# Patient Record
Sex: Male | Born: 2000 | Race: White | Hispanic: No | Marital: Single | Smoking: Former smoker
Health system: Southern US, Community
[De-identification: ages and names within clinical notes are randomized; demographics above are authoritative.]

## PROBLEM LIST (undated history)

## (undated) DIAGNOSIS — T7840XA Allergy, unspecified, initial encounter: Secondary | ICD-10-CM

## (undated) DIAGNOSIS — F410 Panic disorder [episodic paroxysmal anxiety] without agoraphobia: Secondary | ICD-10-CM

## (undated) DIAGNOSIS — K219 Gastro-esophageal reflux disease without esophagitis: Secondary | ICD-10-CM

## (undated) DIAGNOSIS — F32A Depression, unspecified: Secondary | ICD-10-CM

## (undated) DIAGNOSIS — J45909 Unspecified asthma, uncomplicated: Secondary | ICD-10-CM

## (undated) HISTORY — DX: Gastro-esophageal reflux disease without esophagitis: K21.9

## (undated) HISTORY — DX: Unspecified asthma, uncomplicated: J45.909

## (undated) HISTORY — DX: Allergy, unspecified, initial encounter: T78.40XA

## (undated) HISTORY — DX: Depression, unspecified: F32.A

---
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
## 2022-02-09 DIAGNOSIS — F418 Other specified anxiety disorders: Secondary | ICD-10-CM | POA: Insufficient documentation

## 2022-02-17 DIAGNOSIS — R002 Palpitations: Secondary | ICD-10-CM | POA: Insufficient documentation

## 2022-11-23 DIAGNOSIS — M25512 Pain in left shoulder: Secondary | ICD-10-CM | POA: Insufficient documentation

## 2023-12-09 ENCOUNTER — Emergency Department (HOSPITAL_COMMUNITY)
Admission: EM | Admit: 2023-12-09 | Discharge: 2023-12-10 | Disposition: A | Discharge status: Home / Self Care | Attending: Emergency Medicine | Admitting: Emergency Medicine

## 2023-12-09 ENCOUNTER — Encounter (HOSPITAL_COMMUNITY): Payer: Self-pay

## 2023-12-09 DIAGNOSIS — K219 Gastro-esophageal reflux disease without esophagitis: Secondary | ICD-10-CM | POA: Insufficient documentation

## 2023-12-09 DIAGNOSIS — R079 Chest pain, unspecified: Secondary | ICD-10-CM | POA: Diagnosis present

## 2023-12-09 HISTORY — DX: Panic disorder (episodic paroxysmal anxiety): F41.0

## 2023-12-09 LAB — COMPREHENSIVE METABOLIC PANEL WITH GFR
ALT: 36 U/L (ref 0–44)
AST: 19 U/L (ref 15–41)
Albumin: 4.6 g/dL (ref 3.5–5.0)
Alkaline Phosphatase: 68 U/L (ref 38–126)
Anion gap: 10 (ref 5–15)
BUN: 11 mg/dL (ref 6–20)
CO2: 27 mmol/L (ref 22–32)
Calcium: 9.9 mg/dL (ref 8.9–10.3)
Chloride: 104 mmol/L (ref 98–111)
Creatinine, Ser: 0.88 mg/dL (ref 0.61–1.24)
GFR, Estimated: >60 mL/min (ref >60)
Glucose, Bld: 94 mg/dL (ref 70–99)
Potassium: 4.3 mmol/L (ref 3.5–5.1)
Sodium: 141 mmol/L (ref 135–145)
Total Bilirubin: 2.2 mg/dL — ABNORMAL HIGH (ref 0.0–1.2)
Total Protein: 7.5 g/dL (ref 6.5–8.1)

## 2023-12-09 LAB — CBC WITH DIFFERENTIAL/PLATELET
Abs Immature Granulocytes: 0.03 K/uL (ref 0.00–0.07)
Basophils Absolute: 0.1 K/uL (ref 0.0–0.1)
Basophils Relative: 1 %
Eosinophils Absolute: 0.5 K/uL (ref 0.0–0.5)
Eosinophils Relative: 7 %
HCT: 44.7 % (ref 39.0–52.0)
Hemoglobin: 15 g/dL (ref 13.0–17.0)
Immature Granulocytes: 0 %
Lymphocytes Relative: 44 %
Lymphs Abs: 3 K/uL (ref 0.7–4.0)
MCH: 31.4 pg (ref 26.0–34.0)
MCHC: 33.6 g/dL (ref 30.0–36.0)
MCV: 93.5 fL (ref 80.0–100.0)
Monocytes Absolute: 0.5 K/uL (ref 0.1–1.0)
Monocytes Relative: 8 %
Neutro Abs: 2.8 K/uL (ref 1.7–7.7)
Neutrophils Relative %: 40 %
Platelets: 277 K/uL (ref 150–400)
RBC: 4.78 MIL/uL (ref 4.22–5.81)
RDW: 12.8 % (ref 11.5–15.5)
WBC: 6.9 K/uL (ref 4.0–10.5)
nRBC: 0 % (ref 0.0–0.2)

## 2023-12-09 LAB — TROPONIN I (HIGH SENSITIVITY): Troponin I (High Sensitivity): <2 ng/L (ref <18)

## 2023-12-09 NOTE — ED Triage Notes (Addendum)
 Pt's pain started out as heartburn last night and has gradually has gotten worse to where the pt is SOB. Pt is also has abd. Pain. A&Ox4. Pt walked to triage from waiting room with no issues. Pt states he took tums for his heartburn

## 2023-12-10 LAB — TROPONIN I (HIGH SENSITIVITY): Troponin I (High Sensitivity): 2 ng/L (ref <18)

## 2023-12-10 MED ORDER — PANTOPRAZOLE SODIUM 20 MG PO TBEC: 20.0000 mg | DELAYED_RELEASE_TABLET | Freq: Every day | ORAL | 1 refills | Status: DC

## 2023-12-10 MED ORDER — ALUM & MAG HYDROXIDE-SIMETH 200-200-20 MG/5ML PO SUSP
30.0000 mL | Freq: Once | ORAL | Status: AC
  Administered 2023-12-10: 30 mL via ORAL
  Filled 2023-12-10: qty 30

## 2023-12-10 NOTE — Discharge Instructions (Addendum)
 You were seen today for chest pain.  Your workup is reassuring.  This is likely related to reflux.  Avoid eating late at night.  Avoid excessive alcohol use or NSAID use.  Trial pantoprazole.  This needs to be taken daily to reduce acid production.

## 2023-12-10 NOTE — ED Provider Notes (Signed)
 [place] EMERGENCY DEPARTMENT AT [facility] Provider Note   CSN: [id number] Arrival date & time: 12/09/23  2128     Patient presents with: Anxiety, Panic Attack, and Chest Pain   Hunter Jordan is a 23 y.o. male.   HPI     This is a 23 year old male who presents with acid reflux.  Patient reports he had severe acid reflux starting last night.  He ate 3 pieces of pizza prior to going to bed.  He would lay down and woke up with intense burning pain in his chest.  He states that he took Tums with minimal relief.  Pain persisted and he developed some shortness of breath today.  Since present hunting, symptoms have essentially resolved.  He did have 1 episode of emesis last night.  Has previously been on omeprazole for acid reflux symptoms.  Prior to Admission medications   Medication Sig Start Date End Date Taking? Authorizing Provider  pantoprazole (PROTONIX) 20 MG tablet Take 1 tablet (20 mg total) by mouth daily. 12/10/23  Yes Cordella Nyquist, Charmaine FALCON, MD  cyclobenzaprine  (FLEXERIL ) 5 MG tablet Take 1 tablet (5 mg total) by mouth 3 (three) times daily as needed (muscle soreness). 10/24/19   Knapp, Iva, MD  naproxen  (NAPROSYN ) 250 MG tablet Take 1 tablet (250 mg total) by mouth 2 (two) times daily. 10/24/19   Knapp, Iva, MD    Allergies: Patient has no allergy information on record.    Review of Systems  Constitutional:  Negative for fever.  Cardiovascular:  Positive for chest pain.  Gastrointestinal:  Positive for abdominal pain, nausea and vomiting.  All other systems reviewed and are negative.   Updated Vital Signs BP 118/75   Pulse (!) 59   Temp 98.9 F (37.2 C) (Oral)   Resp 18   Ht 1.803 m (5' 11)   Wt 92.5 kg   SpO2 97%   BMI 28.45 kg/m   Physical Exam Vitals and nursing note reviewed.  Constitutional:      Appearance: He is well-developed.  HENT:     Head: Normocephalic and atraumatic.  Eyes:     Pupils: Pupils are equal, round, and reactive to  light.  Cardiovascular:     Rate and Rhythm: Normal rate and regular rhythm.     Heart sounds: Normal heart sounds. No murmur heard. Pulmonary:     Effort: Pulmonary effort is normal. No respiratory distress.     Breath sounds: Normal breath sounds. No wheezing.  Abdominal:     General: Bowel sounds are normal.     Palpations: Abdomen is soft.     Tenderness: There is no abdominal tenderness. There is no rebound.  Musculoskeletal:     Cervical back: Neck supple.  Lymphadenopathy:     Cervical: No cervical adenopathy.  Skin:    General: Skin is warm and dry.  Neurological:     General: No focal deficit present.     Mental Status: He is alert and oriented to person, place, and time.     (all labs ordered are listed, but only abnormal results are displayed) Labs Reviewed  COMPREHENSIVE METABOLIC PANEL WITH GFR - Abnormal; Notable for the following components:      Result Value   Total Bilirubin 2.2 (*)    All other components within normal limits  CBC WITH DIFFERENTIAL/PLATELET  TROPONIN I (HIGH SENSITIVITY)  TROPONIN I (HIGH SENSITIVITY)    EKG: EKG Interpretation Date/Time:  Thursday December 09 2023 21:35:55 EDT Ventricular Rate:  68 PR Interval:  154 QRS Duration:  94 QT Interval:  344 QTC Calculation: 365 R Axis:   91  Text Interpretation: Normal sinus rhythm Rightward axis Borderline ECG No previous ECGs available Confirmed by Bari Pfeiffer ([id number]) on 12/10/2023 3:11:14 AM  Radiology: No results found.   Procedures   Medications Ordered in the ED  alum & mag hydroxide-simeth (MAALOX/MYLANTA) 200-200-20 MG/5ML suspension 30 mL (has no administration in time range)                                    Medical Decision Making Amount and/or Complexity of Data Reviewed Labs: ordered.  Risk OTC drugs. Prescription drug management.   This patient presents to the ED for concern of acid reflux, chest pain, this involves an extensive number of treatment  options, and is a complaint that carries with it a high risk of complications and morbidity.  I considered the following differential and admission for this acute, potentially life threatening condition.  The differential diagnosis includes reflux, ACS, pancreatitis, cholecystitis  MDM:    This is a 23 year old male who presents with burning chest discomfort.  Symptoms have essentially resolved.  Exam is benign.  No abdominal pain or tenderness to palpation.  Labs reviewed from triage.  Normal troponin.  Normal metabolites and LFTs.  EKG is without acute ischemia or arrhythmia.  Given his age and classic symptoms, suspect reflux.  Will start on pantoprazole.  Discussed avoiding alcohol and NSAIDs.  (Labs, imaging, consults)  Labs: I Ordered, and personally interpreted labs.  The pertinent results include: CBC, CMP, troponin  Imaging Studies ordered: I ordered imaging studies including none I independently visualized and interpreted imaging. I agree with the radiologist interpretation  Additional history obtained from chart review.  External records from outside source obtained and reviewed including prior evaluations  Cardiac Monitoring: The patient was not maintained on a cardiac monitor.  If on the cardiac monitor, I personally viewed and interpreted the cardiac monitored which showed an underlying rhythm of: N/A  Reevaluation: After the interventions noted above, I reevaluated the patient and found that they have :resolved  Social Determinants of Health:  lives independently  Disposition: Discharge  Co morbidities that complicate the patient evaluation  Past Medical History:  Diagnosis Date   Panic      Medicines Meds ordered this encounter  Medications   alum & mag hydroxide-simeth (MAALOX/MYLANTA) 200-200-20 MG/5ML suspension 30 mL   pantoprazole (PROTONIX) 20 MG tablet    Sig: Take 1 tablet (20 mg total) by mouth daily.    Dispense:  30 tablet    Refill:  1    I  have reviewed the patients home medicines and have made adjustments as needed  Problem List / ED Course: Problem List Items Addressed This Visit   None Visit Diagnoses       Gastroesophageal reflux disease, unspecified whether esophagitis present    -  Primary   Relevant Medications   alum & mag hydroxide-simeth (MAALOX/MYLANTA) 200-200-20 MG/5ML suspension 30 mL (Start on 12/10/2023  3:15 AM)   pantoprazole (PROTONIX) 20 MG tablet                Final diagnoses:  Gastroesophageal reflux disease, unspecified whether esophagitis present    ED Discharge Orders          Ordered    pantoprazole (PROTONIX) 20 MG tablet  Daily  12/10/23 9688               Bari Charmaine FALCON, MD 12/10/23 [phone]

## 2024-03-21 ENCOUNTER — Emergency Department (HOSPITAL_COMMUNITY)
Admission: EM | Admit: 2024-03-21 | Discharge: 2024-03-21 | Disposition: A | Discharge status: Home / Self Care | Payer: Self-pay | Attending: Emergency Medicine | Admitting: Emergency Medicine

## 2024-03-21 ENCOUNTER — Encounter (HOSPITAL_COMMUNITY): Payer: Self-pay | Admitting: *Deleted

## 2024-03-21 ENCOUNTER — Other Ambulatory Visit: Payer: Self-pay

## 2024-03-21 ENCOUNTER — Emergency Department (HOSPITAL_COMMUNITY)

## 2024-03-21 DIAGNOSIS — R079 Chest pain, unspecified: Secondary | ICD-10-CM | POA: Insufficient documentation

## 2024-03-21 DIAGNOSIS — R0602 Shortness of breath: Secondary | ICD-10-CM | POA: Insufficient documentation

## 2024-03-21 DIAGNOSIS — R55 Syncope and collapse: Secondary | ICD-10-CM | POA: Insufficient documentation

## 2024-03-21 DIAGNOSIS — H538 Other visual disturbances: Secondary | ICD-10-CM | POA: Insufficient documentation

## 2024-03-21 LAB — BASIC METABOLIC PANEL WITH GFR
Anion gap: 13 (ref 5–15)
BUN: 12 mg/dL (ref 6–20)
CO2: 27 mmol/L (ref 22–32)
Calcium: 10 mg/dL (ref 8.9–10.3)
Chloride: 104 mmol/L (ref 98–111)
Creatinine, Ser: 0.83 mg/dL (ref 0.61–1.24)
GFR, Estimated: >60 mL/min (ref >60)
Glucose, Bld: 94 mg/dL (ref 70–99)
Potassium: 3.7 mmol/L (ref 3.5–5.1)
Sodium: 143 mmol/L (ref 135–145)

## 2024-03-21 LAB — CBC WITH DIFFERENTIAL/PLATELET
Abs Immature Granulocytes: 0.03 K/uL (ref 0.00–0.07)
Basophils Absolute: 0.1 K/uL (ref 0.0–0.1)
Basophils Relative: 1 %
Eosinophils Absolute: 0.5 K/uL (ref 0.0–0.5)
Eosinophils Relative: 8 %
HCT: 44.8 % (ref 39.0–52.0)
Hemoglobin: 15.2 g/dL (ref 13.0–17.0)
Immature Granulocytes: 1 %
Lymphocytes Relative: 41 %
Lymphs Abs: 2.7 K/uL (ref 0.7–4.0)
MCH: 31.5 pg (ref 26.0–34.0)
MCHC: 33.9 g/dL (ref 30.0–36.0)
MCV: 92.8 fL (ref 80.0–100.0)
Monocytes Absolute: 0.5 K/uL (ref 0.1–1.0)
Monocytes Relative: 8 %
Neutro Abs: 2.7 K/uL (ref 1.7–7.7)
Neutrophils Relative %: 41 %
Platelets: 301 K/uL (ref 150–400)
RBC: 4.83 MIL/uL (ref 4.22–5.81)
RDW: 12.7 % (ref 11.5–15.5)
WBC: 6.6 K/uL (ref 4.0–10.5)
nRBC: 0 % (ref 0.0–0.2)

## 2024-03-21 LAB — TROPONIN T, HIGH SENSITIVITY
Troponin T High Sensitivity: <15 ng/L (ref 0–19)
Troponin T High Sensitivity: <15 ng/L (ref 0–19)

## 2024-03-21 LAB — CBG MONITORING, ED: Glucose-Capillary: 97 mg/dL (ref 70–99)

## 2024-03-21 LAB — LIPASE, BLOOD: Lipase: 21 U/L (ref 11–51)

## 2024-03-21 NOTE — ED Provider Notes (Signed)
 [place] EMERGENCY DEPARTMENT AT [facility] Provider Note   CSN: [id number] Arrival date & time: 03/21/24  1703     Patient presents with: Near Syncope   Carolyn Dannon Nguyenthi is a 23 y.o. male.  He said he was driving today when he acutely felt like he could not breeze and then became very dizzy and lightheaded and felt like he was blacking out.  He is not sure he fully went unconscious but he was sweaty and nauseous.  He said he has had this happen before and they have called the panic attacks.  He denies that he was anxious about anything.  He he said he has seen a cardiologist before.  Was prescribed hydroxyzine but does not like taking it because it makes him feel drowsy in the morning.  Concerned he may have had a stroke.  Does not have a primary care doctor yet because recently moved back in the area but is working on getting his Medicaid reinstated.  {Add pertinent medical, surgical, social history, OB history to YEP:67052} The history is provided by the patient.  Near Syncope This is a recurrent problem. The problem occurs rarely. The problem has been resolved. Associated symptoms include chest pain and shortness of breath. Pertinent negatives include no abdominal pain and no headaches. Nothing aggravates the symptoms. Nothing relieves the symptoms. He has tried nothing for the symptoms. The treatment provided no relief.       Prior to Admission medications   Medication Sig Start Date End Date Taking? Authorizing Provider  cyclobenzaprine  (FLEXERIL ) 5 MG tablet Take 1 tablet (5 mg total) by mouth 3 (three) times daily as needed (muscle soreness). 10/24/19   Knapp, Iva, MD  hydrOXYzine (ATARAX) 25 MG tablet Take 25 mg by mouth every 6 (six) hours as needed for anxiety or itching (allergies). 10/23/23 04/05/24  [provider]  naproxen  (NAPROSYN ) 250 MG tablet Take 1 tablet (250 mg total) by mouth 2 (two) times daily. 10/24/19   Knapp, Iva, MD  pantoprazole   (PROTONIX ) 20 MG tablet Take 1 tablet (20 mg total) by mouth daily. 12/10/23   Horton, Charmaine FALCON, MD    Allergies: Patient has no known allergies.    Review of Systems  Constitutional:  Positive for diaphoresis. Negative for fever.  Eyes:  Negative for pain.  Respiratory:  Positive for shortness of breath.   Cardiovascular:  Positive for chest pain and near-syncope.  Gastrointestinal:  Positive for nausea. Negative for abdominal pain.  Neurological:  Negative for headaches.    Updated Vital Signs BP (!) 138/92   Pulse 65   Temp 98.1 F (36.7 C) (Oral)   Resp 14   Ht 5' 11 (1.803 m)   Wt 89.8 kg   SpO2 98%   BMI 27.62 kg/m   Physical Exam Vitals and nursing note reviewed.  Constitutional:      General: He is not in acute distress.    Appearance: Normal appearance. He is well-developed.  HENT:     Head: Normocephalic and atraumatic.  Eyes:     Conjunctiva/sclera: Conjunctivae normal.  Cardiovascular:     Rate and Rhythm: Normal rate and regular rhythm.     Heart sounds: No murmur heard. Pulmonary:     Effort: Pulmonary effort is normal. No respiratory distress.     Breath sounds: Normal breath sounds.  Abdominal:     Palpations: Abdomen is soft.     Tenderness: There is no abdominal tenderness. There is no guarding or rebound.  Musculoskeletal:        General: No deformity.     Cervical back: Neck supple.  Skin:    General: Skin is warm and dry.     Capillary Refill: Capillary refill takes less than 2 seconds.  Neurological:     General: No focal deficit present.     Mental Status: He is alert and oriented to person, place, and time.     Sensory: No sensory deficit.     Motor: No weakness.     (all labs ordered are listed, but only abnormal results are displayed) Labs Reviewed  CBC WITH DIFFERENTIAL/PLATELET  BASIC METABOLIC PANEL WITH GFR  LIPASE, BLOOD  CBG MONITORING, ED  TROPONIN T, HIGH SENSITIVITY  TROPONIN T, HIGH SENSITIVITY    EKG: EKG  Interpretation Date/Time:  Tuesday March 21 2024 19:13:47 EST Ventricular Rate:  67 PR Interval:  159 QRS Duration:  100 QT Interval:  365 QTC Calculation: 386 R Axis:   86  Text Interpretation: Sinus rhythm No significant change since last tracing Confirmed by Towana Sharper [phone]) on 03/21/2024 7:23:12 PM  Radiology: No results found.  {Document cardiac monitor, telemetry assessment procedure when appropriate:32947} Procedures   Medications Ordered in the ED - No data to display    {Click here for ABCD2, HEART and other calculators REFRESH Note before signing:1}                              Medical Decision Making Amount and/or Complexity of Data Reviewed Labs: ordered.   This patient complains of ***; this involves an extensive number of treatment Options and is a complaint that carries with it a high risk of complications and morbidity. The differential includes ***  I ordered, reviewed and interpreted labs, which included *** I ordered medication *** and reviewed PMP when indicated. I ordered imaging studies which included *** and I independently    visualized and interpreted imaging which showed *** Additional history obtained from *** Previous records obtained and reviewed *** I consulted *** and discussed lab and imaging findings and discussed disposition.  Cardiac monitoring reviewed, *** Social determinants considered, *** Critical Interventions: ***  After the interventions stated above, I reevaluated the patient and found *** Admission and further testing considered, ***   {Document critical care time when appropriate  Document review of labs and clinical decision tools ie CHADS2VASC2, etc  Document your independent review of radiology images and any outside records  Document your discussion with family members, caretakers and with consultants  Document social determinants of health affecting pt's care  Document your decision making why or why not  admission, treatments were needed:32947:::1}   Final diagnoses:  None    ED Discharge Orders     None

## 2024-03-21 NOTE — ED Triage Notes (Signed)
 Pt was driving home with near syncope with some SOB, continued SOB. + nausea +CP-heart burn x 2 days.  Pt states he was shocked at work  on Saturday and was seen at [facility]. Pt is speaking in complete sentences. + blurry vision

## 2024-03-21 NOTE — Discharge Instructions (Signed)
 You were seen in the emergency department after a near fainting episode along with chest pain.  You had lab work EKG and a CAT scan of your head that did not show an obvious explanation for your symptoms.  It will be important for you to establish care with a primary care doctor so they can set you up for further testing.  Please keep well-hydrated.  Return to the emergency department if any worsening or concerning symptoms

## 2024-03-23 ENCOUNTER — Encounter: Payer: Self-pay | Admitting: Family Medicine

## 2024-03-23 ENCOUNTER — Ambulatory Visit: Attending: Family Medicine

## 2024-03-23 ENCOUNTER — Ambulatory Visit: Payer: Self-pay | Admitting: Family Medicine

## 2024-03-23 VITALS — BP 124/83 | HR 73 | Temp 98.2°F | Resp 20 | Ht 71.0 in | Wt 199.0 lb

## 2024-03-23 DIAGNOSIS — R002 Palpitations: Secondary | ICD-10-CM

## 2024-03-23 DIAGNOSIS — F419 Anxiety disorder, unspecified: Secondary | ICD-10-CM

## 2024-03-23 MED ORDER — HYDROXYZINE HCL 10 MG PO TABS: 10.0000 mg | ORAL_TABLET | Freq: Three times a day (TID) | ORAL | 0 refills | Status: DC | PRN

## 2024-03-23 MED ORDER — ESCITALOPRAM OXALATE 5 MG PO TABS: 5.0000 mg | ORAL_TABLET | Freq: Every day | ORAL | 0 refills | Status: DC

## 2024-03-23 MED ORDER — BUSPIRONE HCL 5 MG PO TABS: 5.0000 mg | ORAL_TABLET | Freq: Three times a day (TID) | ORAL | 3 refills | Status: DC

## 2024-03-23 NOTE — Assessment & Plan Note (Signed)
 Will arrange for him to have a Zio patch for 2 weeks.

## 2024-03-23 NOTE — Progress Notes (Signed)
 New Patient Office Visit  Subjective    Patient ID: Hunter Jordan, male    DOB: Oct 28, 2000  Age: 23 y.o. MRN: [id number]  CC:  Chief Complaint  Patient presents with   Establish Care   Follow-up   Anxiety   Depression    HPI Hunter Jordan presents to establish care 23 year old here with his fiance, Chiquita.  He reports that he has anxiety, GAD, panic attacks and SAD.  He has also been told he has elevated cholesterol and his bilirubin was elevated.  He reports he is getting an ultrasound of his gallbladder tomorrow at [facility].  He also reports a history of heartburn and was taking omeprazole but has not been taking it recently. He went to the emergency room.  He felt that he could not breathe well and his vision got black.  Had numbness and tingling all over that he felt very confused and was disoriented.  He complained of having blurred vision and amnesia.  He had a workup that was benign.  He was not given any medicine. He saw a cardiologist in February 2023 in [place] .  He was supposed to wear a monitor for 2 weeks but only wore it for 3 days because of perspiration.  He had an echo with LVEF 55 to 65% and normal valves per patient. He has worked with a therapist, sports in the past and has been on a myriad of medications.  He thinks that Lexapro was most  effective for him.  He would take Lexapro and then his anxiety and panic with stop.  Then he would stop the Lexapro.  I hate that medicine.  He feels that he should be able to control his anxiety and panic without taking medication.  He has never worked with a warden/ranger on breathing techniques.  He reports that he went to the hospital 18 times in October 2024 and each time he felt like he was dying.  He has also taken Ativan hydroxyzine and BuSpar. He reports he has no nicotine addiction, does not drink alcohol and does not use drugs.         Outpatient Encounter Medications as of 03/23/2024   Medication Sig   busPIRone (BUSPAR) 5 MG tablet Take 1 tablet (5 mg total) by mouth 3 (three) times daily.   escitalopram (LEXAPRO) 5 MG tablet Take 1 tablet (5 mg total) by mouth daily.   hydrOXYzine (ATARAX) 10 MG tablet Take 1 tablet (10 mg total) by mouth 3 (three) times daily as needed.   [DISCONTINUED] hydrOXYzine (ATARAX) 25 MG tablet Take 25 mg by mouth every 6 (six) hours as needed for anxiety or itching (allergies).   cyclobenzaprine  (FLEXERIL ) 5 MG tablet Take 1 tablet (5 mg total) by mouth 3 (three) times daily as needed (muscle soreness).   naproxen  (NAPROSYN ) 250 MG tablet Take 1 tablet (250 mg total) by mouth 2 (two) times daily.   pantoprazole  (PROTONIX ) 20 MG tablet Take 1 tablet (20 mg total) by mouth daily.   No facility-administered encounter medications on file as of 03/23/2024.    Past Medical History:  Diagnosis Date   Panic     History reviewed. No pertinent surgical history.  History reviewed. No pertinent family history.  Social History   Socioeconomic History   Marital status: Single    Spouse name: Not on file   Number of children: Not on file   Years of education: Not on file   Highest education level: Not  on file  Occupational History   Not on file  Tobacco Use   Smoking status: Never   Smokeless tobacco: Never  Substance and Sexual Activity   Alcohol use: Not on file   Drug use: Not on file   Sexual activity: Yes    Partners: Male  Other Topics Concern   Not on file  Social History Narrative   Not on file   Social Drivers of Health   Financial Resource Strain: Low Risk (12/03/2023)   Received from [facility]   Overall Financial Resource Strain (CARDIA)    How hard is it for you to pay for the very basics like food, housing, medical care, and heating?: Not very hard  Food Insecurity: No Food Insecurity (12/03/2023)   Received from [facility]   Hunger Vital Sign    Within the past 12 months, you worried that your food would  run out before you got the money to buy more.: Never true    Within the past 12 months, the food you bought just didn't last and you didn't have money to get more.: Never true  Transportation Needs: No Transportation Needs (12/03/2023)   Received from [facility] - Transportation    Lack of Transportation (Medical): No    Lack of Transportation (Non-Medical): No  Physical Activity: Not on file  Stress: Not on file  Social Connections: Unknown (09/23/2021)   Received from [facility]   Social Network    Social Network: Not on file  Intimate Partner Violence: Not At Risk (12/03/2023)   Received from [facility]   Humiliation, Afraid, Rape, and Kick questionnaire    Within the last year, have you been afraid of your partner or ex-partner?: No    Within the last year, have you been humiliated or emotionally abused in other ways by your partner or ex-partner?: No    Within the last year, have you been kicked, hit, slapped, or otherwise physically hurt by your partner or ex-partner?: No    Within the last year, have you been raped or forced to have any kind of sexual activity by your partner or ex-partner?: No    ROS      Objective   BP 124/83 (BP Location: Right Arm, Patient Position: Sitting, Cuff Size: Normal)   Pulse 73   Temp 98.2 F (36.8 C) (Oral)   Resp 20   Ht 5' 11 (1.803 m)   Wt 199 lb (90.3 kg)   SpO2 95%   BMI 27.75 kg/m    Physical Exam Vitals and nursing note reviewed.  Constitutional:      Appearance: Normal appearance.  HENT:     Head: Normocephalic and atraumatic.  Eyes:     Conjunctiva/sclera: Conjunctivae normal.  Cardiovascular:     Rate and Rhythm: Normal rate and regular rhythm.  Pulmonary:     Effort: Pulmonary effort is normal.     Breath sounds: Normal breath sounds.  Musculoskeletal:     Right lower leg: No edema.     Left lower leg: No edema.  Skin:    General: Skin is warm and dry.  Neurological:     Mental Status: He  is alert and oriented to person, place, and time.  Psychiatric:        Mood and Affect: Mood normal.        Behavior: Behavior normal.        Thought Content: Thought content normal.  Judgment: Judgment normal.            The ASCVD Risk score (Arnett DK, et al., 2019) failed to calculate for the following reasons:   The 2019 ASCVD risk score is only valid for ages 52 to 84     Assessment & Plan:  Anxiety -     Escitalopram Oxalate; Take 1 tablet (5 mg total) by mouth daily.  Dispense: 30 tablet; Refill: 0 -     busPIRone HCl; Take 1 tablet (5 mg total) by mouth 3 (three) times daily.  Dispense: 90 tablet; Refill: 3 -     hydrOXYzine HCl; Take 1 tablet (10 mg total) by mouth 3 (three) times daily as needed.  Dispense: 90 tablet; Refill: 0 -     LONG TERM MONITOR (3-14 DAYS); Future  Palpitations Assessment & Plan: Will arrange for him to have a Zio patch for 2 weeks.     Return in about 4 weeks (around 04/20/2024).   Hendrik Donath K Jacquan Savas, MD

## 2024-03-25 ENCOUNTER — Encounter: Payer: Self-pay | Admitting: Family Medicine

## 2024-03-25 DIAGNOSIS — R519 Headache, unspecified: Secondary | ICD-10-CM

## 2024-03-27 ENCOUNTER — Telehealth: Payer: Self-pay

## 2024-03-27 NOTE — Telephone Encounter (Signed)
 Copied from CRM [id number]. Topic: Clinical - Lab/Test Results >> Mar 27, 2024 12:49 PM Winona R wrote: Pt would like a call back to go over his ultrasound results.

## 2024-03-29 ENCOUNTER — Telehealth: Payer: Self-pay | Admitting: Family Medicine

## 2024-03-29 NOTE — Telephone Encounter (Unsigned)
 Copied from CRM [id number]. Topic: Clinical - Lab/Test Results >> Mar 27, 2024 12:49 PM Winona R wrote: Pt would like a call back to go over his ultrasound results. >> Mar 29, 2024 12:45 PM Tysheama G wrote: Patient is calling again regarding his test results. He wanted someone to go over them with him and help him understand asap since he never received a call Monday. Callback number [phone]

## 2024-03-29 NOTE — Telephone Encounter (Signed)
 Reviewed his Gall bladder US .  He has a fatty liver and a polyp in his Gall bladder.  His ALT is elevated to 82.  Ha an appointment in December.  Will address his elevated LFTs then.

## 2024-04-06 ENCOUNTER — Emergency Department (HOSPITAL_COMMUNITY)
Admission: EM | Admit: 2024-04-06 | Discharge: 2024-04-06 | Disposition: A | Discharge status: Home / Self Care | Payer: Self-pay | Attending: Emergency Medicine | Admitting: Emergency Medicine

## 2024-04-06 DIAGNOSIS — R519 Headache, unspecified: Secondary | ICD-10-CM | POA: Insufficient documentation

## 2024-04-06 DIAGNOSIS — T50905A Adverse effect of unspecified drugs, medicaments and biological substances, initial encounter: Secondary | ICD-10-CM

## 2024-04-06 MED ORDER — MELOXICAM 15 MG PO TABS: 15.0000 mg | ORAL_TABLET | Freq: Every day | ORAL | 0 refills | Status: AC

## 2024-04-06 MED ORDER — KETOROLAC TROMETHAMINE 60 MG/2ML IM SOLN
60.0000 mg | Freq: Once | INTRAMUSCULAR | Status: AC
  Administered 2024-04-06: 60 mg via INTRAMUSCULAR
  Filled 2024-04-06: qty 2

## 2024-04-06 NOTE — ED Triage Notes (Signed)
 Pt comes in for weakness and feeling heavy. Pt also has numbness in the tongue. After the decadron was given at [facility] for a migraine. Pt also has a pulsating headache. Pt is A&Ox4.

## 2024-04-06 NOTE — ED Provider Notes (Signed)
 [place] EMERGENCY DEPARTMENT AT [facility] Provider Note   CSN: [id number] Arrival date & time: 04/06/24  1717     Patient presents with: Weakness   Hunter Jordan is a 23 y.o. male.    Weakness  This patient is a 23 year old male with a history of anxiety, he is on buspirone , hydroxyzine , Lexapro , he presents to the hospital with a complaint of numbness of his tongue, a feeling of numbness and weakness in his body, feeling of intermittent palpitations and ongoing headache.  He was seen at [facility] yesterday and had a CT scan of the brain without contrast which was totally unremarkable, he was given Decadron and within minutes of getting the Decadron he had the onset of the symptoms in his body including intermittent palpitations, numbness of the tongue which has not gone away and some intermittent blurry vision.  The patient denies chest pain coughing or shortness of breath.  His palpitations are not present at this time.  He is currently wearing a Zio patch because of a complaint of palpitations and follows with a cardiologist for that.    Prior to Admission medications   Medication Sig Start Date End Date Taking? Authorizing Provider  busPIRone  (BUSPAR ) 5 MG tablet Take 1 tablet (5 mg total) by mouth 3 (three) times daily. 03/23/24   Ziglar, Susan K, MD  cyclobenzaprine  (FLEXERIL ) 5 MG tablet Take 1 tablet (5 mg total) by mouth 3 (three) times daily as needed (muscle soreness). 10/24/19   Knapp, Iva, MD  escitalopram  (LEXAPRO ) 5 MG tablet Take 1 tablet (5 mg total) by mouth daily. 03/23/24   Ziglar, Susan K, MD  hydrOXYzine  (ATARAX ) 10 MG tablet Take 1 tablet (10 mg total) by mouth 3 (three) times daily as needed. 03/23/24   Ziglar, Susan K, MD  naproxen  (NAPROSYN ) 250 MG tablet Take 1 tablet (250 mg total) by mouth 2 (two) times daily. 10/24/19   Knapp, Iva, MD  pantoprazole  (PROTONIX ) 20 MG tablet Take 1 tablet (20 mg total) by mouth daily. 12/10/23   Horton,  Charmaine FALCON, MD    Allergies: Patient has no known allergies.    Review of Systems  Neurological:  Positive for weakness.  All other systems reviewed and are negative.   Updated Vital Signs BP (!) 142/92   Pulse 97   Temp 98.3 F (36.8 C) (Oral)   Resp 20   Ht 1.803 m (5' 11)   Wt 88.5 kg   SpO2 99%   BMI 27.20 kg/m   Physical Exam Vitals and nursing note reviewed.  Constitutional:      General: He is not in acute distress.    Appearance: He is well-developed.  HENT:     Head: Normocephalic and atraumatic.     Mouth/Throat:     Pharynx: No oropharyngeal exudate.  Eyes:     General: No scleral icterus.       Right eye: No discharge.        Left eye: No discharge.     Conjunctiva/sclera: Conjunctivae normal.     Pupils: Pupils are equal, round, and reactive to light.  Neck:     Thyroid: No thyromegaly.     Vascular: No JVD.  Cardiovascular:     Rate and Rhythm: Normal rate and regular rhythm.     Heart sounds: Normal heart sounds. No murmur heard.    No friction rub. No gallop.  Pulmonary:     Effort: Pulmonary effort is normal. No respiratory distress.  Breath sounds: Normal breath sounds. No wheezing or rales.  Abdominal:     General: Bowel sounds are normal. There is no distension.     Palpations: Abdomen is soft. There is no mass.     Tenderness: There is no abdominal tenderness.  Musculoskeletal:        General: No tenderness. Normal range of motion.     Cervical back: Normal range of motion and neck supple.  Lymphadenopathy:     Cervical: No cervical adenopathy.  Skin:    General: Skin is warm and dry.     Findings: No erythema or rash.  Neurological:     Mental Status: He is alert.     Coordination: Coordination normal.     Comments: Normal gait normal speech cranial nerves III through XII normal, normal movement of all 4 extremities including coordination of all 4 extremities and sensation of all 4 extremities.  Psychiatric:        Behavior:  Behavior normal.     (all labs ordered are listed, but only abnormal results are displayed) Labs Reviewed - No data to display  EKG: None  Radiology: No results found.   Procedures   Medications Ordered in the ED  ketorolac  (TORADOL ) injection 60 mg (has no administration in time range)                                    Medical Decision Making Risk Prescription drug management.   Cardiac and neurologic exams are totally unremarkable, the patient's blood pressure is minimally elevated, he does not take any other daily medications, he has not been taking any medications at home for the headache.  Will give him Toradol , he is had a negative CT scan, I reviewed the results and the electronic medical record from the outside hospital.  I do not think he needs any repeat imaging, this does not sound like a stroke syndrome, he reports having similar symptoms when he had IV contrast in the past.  I suspect this is a side effect of the Decadron and recommended that he not take that in the future.  He is not having any arrhythmias at this time  Patient agreeable to the plan     Final diagnoses:  None    ED Discharge Orders     None          Cleotilde Rogue, MD 04/06/24 [phone]

## 2024-04-06 NOTE — Discharge Instructions (Addendum)
 Please take Mobic ,  once daily as needed for pain - this in an antiinflammatory medicine (NSAID) and is similar to ibuprofen - many people feel that it is stronger than ibuprofen and it is easier to take since it is a smaller pill.  Please use this only for 1 week - if your pain persists, you will need to follow up with your doctor in the office for ongoing guidance and pain control.  Thank you for allowing us  to treat you in the emergency department today.  After reviewing your examination and potential testing that was done it appears that you are safe to go home.  I would like for you to follow-up with your doctor within the next several days, have them obtain your records and follow-up with them to review all potential tests and results from your visit.  If you should develop severe or worsening symptoms return to the emergency department immediately  Stay out of work for the next several days, lots of fluids

## 2024-04-06 NOTE — ED Notes (Signed)
 Upon head to toe assessment the pt endorses that his headache is not really a headache that it is a lot of pressure that he feels on both sides behind his eyes.

## 2024-04-07 ENCOUNTER — Emergency Department (HOSPITAL_COMMUNITY)
Admission: EM | Admit: 2024-04-07 | Discharge: 2024-04-07 | Disposition: A | Discharge status: Home / Self Care | Payer: Self-pay

## 2024-04-07 ENCOUNTER — Other Ambulatory Visit: Payer: Self-pay

## 2024-04-07 ENCOUNTER — Telehealth: Payer: Self-pay | Admitting: Family Medicine

## 2024-04-07 DIAGNOSIS — R202 Paresthesia of skin: Secondary | ICD-10-CM | POA: Insufficient documentation

## 2024-04-07 DIAGNOSIS — R519 Headache, unspecified: Secondary | ICD-10-CM | POA: Insufficient documentation

## 2024-04-07 DIAGNOSIS — Z8709 Personal history of other diseases of the respiratory system: Secondary | ICD-10-CM | POA: Insufficient documentation

## 2024-04-07 DIAGNOSIS — R42 Dizziness and giddiness: Secondary | ICD-10-CM | POA: Insufficient documentation

## 2024-04-07 LAB — COMPREHENSIVE METABOLIC PANEL WITH GFR
ALT: 28 U/L (ref 0–44)
AST: 20 U/L (ref 15–41)
Albumin: 5.1 g/dL — ABNORMAL HIGH (ref 3.5–5.0)
Alkaline Phosphatase: 46 U/L (ref 38–126)
Anion gap: 12 (ref 5–15)
BUN: 19 mg/dL (ref 6–20)
CO2: 26 mmol/L (ref 22–32)
Calcium: 9.7 mg/dL (ref 8.9–10.3)
Chloride: 102 mmol/L (ref 98–111)
Creatinine, Ser: 1.19 mg/dL (ref 0.61–1.24)
GFR, Estimated: >60 mL/min (ref >60)
Glucose, Bld: 85 mg/dL (ref 70–99)
Potassium: 3.7 mmol/L (ref 3.5–5.1)
Sodium: 140 mmol/L (ref 135–145)
Total Bilirubin: 2.3 mg/dL — ABNORMAL HIGH (ref 0.0–1.2)
Total Protein: 8 g/dL (ref 6.5–8.1)

## 2024-04-07 LAB — CBC
HCT: 48.8 % (ref 39.0–52.0)
Hemoglobin: 16.2 g/dL (ref 13.0–17.0)
MCH: 31 pg (ref 26.0–34.0)
MCHC: 33.2 g/dL (ref 30.0–36.0)
MCV: 93.3 fL (ref 80.0–100.0)
Platelets: 305 K/uL (ref 150–400)
RBC: 5.23 MIL/uL (ref 4.22–5.81)
RDW: 12.8 % (ref 11.5–15.5)
WBC: 7.5 K/uL (ref 4.0–10.5)
nRBC: 0 % (ref 0.0–0.2)

## 2024-04-07 LAB — URINALYSIS, ROUTINE W REFLEX MICROSCOPIC
Bilirubin Urine: NEGATIVE
Glucose, UA: NEGATIVE mg/dL
Hgb urine dipstick: NEGATIVE
Ketones, ur: NEGATIVE mg/dL
Leukocytes,Ua: NEGATIVE
Nitrite: NEGATIVE
Protein, ur: NEGATIVE mg/dL
Specific Gravity, Urine: 1.018 (ref 1.005–1.030)
pH: 5 (ref 5.0–8.0)

## 2024-04-07 NOTE — ED Provider Notes (Signed)
**Hunter Hunter**  Hunter Hunter   CSN: [id number] Arrival date & time: 04/07/24  1430     Patient presents with: No chief complaint on file.   Hunter Hunter is a 23 y.o. male.   Patient to ED for evaluation of multiple, ongoing symptoms including bilateral frontal headache, dizziness like I'm drunk, blurry vision, nausea/vomiting. No fever. Symptoms started after receiving decadron during a ED visit 11/26. Chart reviewed: CT head 11/26 showing pansinusitis, nonacute otherwise. He is currently wearing a Zio patch by cardiology, referred by PCP. He is currently scheduled for an MRI brain 12/1, ordered by PCP.   The history is provided by the patient. No language interpreter was used.       Prior to Admission medications   Medication Sig Start Date End Date Taking? Authorizing Provider  busPIRone  (BUSPAR ) 5 MG tablet Take 1 tablet (5 mg total) by mouth 3 (three) times daily. 03/23/24   Ziglar, Susan K, MD  cyclobenzaprine  (FLEXERIL ) 5 MG tablet Take 1 tablet (5 mg total) by mouth 3 (three) times daily as needed (muscle soreness). 10/24/19   Knapp, Iva, MD  escitalopram  (LEXAPRO ) 5 MG tablet Take 1 tablet (5 mg total) by mouth daily. 03/23/24   Ziglar, Susan K, MD  hydrOXYzine  (ATARAX ) 10 MG tablet Take 1 tablet (10 mg total) by mouth 3 (three) times daily as needed. 03/23/24   Ziglar, Susan K, MD  meloxicam  (MOBIC ) 15 MG tablet Take 1 tablet (15 mg total) by mouth daily for 14 days. 04/06/24 04/20/24  Hunter Rogue, MD  naproxen  (NAPROSYN ) 250 MG tablet Take 1 tablet (250 mg total) by mouth 2 (two) times daily. 10/24/19   Knapp, Iva, MD  pantoprazole  (PROTONIX ) 20 MG tablet Take 1 tablet (20 mg total) by mouth daily. 12/10/23   Horton, Charmaine FALCON, MD    Allergies: Patient has no known allergies.    Review of Systems  Updated Vital Signs BP 126/82 (BP Location: Left Arm)   Pulse 62   Temp 98.4 F (36.9 C)   Resp 14   SpO2 100%    Physical Exam Constitutional:      Appearance: He is well-developed.  HENT:     Head: Normocephalic.  Neck:     Vascular: No carotid bruit.  Cardiovascular:     Rate and Rhythm: Normal rate and regular rhythm.     Heart sounds: No murmur heard. Pulmonary:     Effort: Pulmonary effort is normal.     Breath sounds: Normal breath sounds. No wheezing, rhonchi or rales.  Abdominal:     General: Bowel sounds are normal.     Palpations: Abdomen is soft.     Tenderness: There is no abdominal tenderness. There is no guarding or rebound.  Musculoskeletal:        General: Normal range of motion.     Cervical back: Normal range of motion and neck supple.  Skin:    General: Skin is warm and dry.  Neurological:     General: No focal deficit present.     Mental Status: He is alert and oriented to person, place, and time.     GCS: GCS eye subscore is 4. GCS verbal subscore is 5. GCS motor subscore is 6.     Cranial Nerves: No cranial nerve deficit, dysarthria or facial asymmetry.     Sensory: Sensation is intact.     Motor: No weakness or pronator drift.     Coordination: Romberg sign  negative. Finger-Nose-Finger Test normal.     Deep Tendon Reflexes:     Reflex Scores:      Patellar reflexes are 2+ on the right side and 2+ on the left side.    (all labs ordered are listed, but only abnormal results are displayed) Labs Reviewed  COMPREHENSIVE METABOLIC PANEL WITH GFR - Abnormal; Notable for the following components:      Result Value   Albumin 5.1 (*)    Total Bilirubin 2.3 (*)    All other components within normal limits  CBC  URINALYSIS, ROUTINE W REFLEX MICROSCOPIC    EKG: None  Radiology: No results found.   Procedures   Medications Ordered in the ED - No data to display  Clinical Course as of 04/07/24 2000  Fri Apr 07, 2024  8046 The patient is in ED for 3rd evaluation in 3 days for similar symptoms. His exam is completely normal. His labs are reviewed by me and  are reassuring without significant abnormality. Total bilirubin 2.3, similar to previous value in 07/25. He has a Zio patch for cardiology evaluation. He has had lab and CT head evaluation which was concerning only for pansinusitis, which he states he has had for a long time.   The patient is reassured that he appears stable based on my physical exam, lab evaluation. No further CT imaging is felt indicated tonight. VSS. Encouraged to follow up with PCP for ongoing evaluation. [SU]    Clinical Course User Index [SU] Hunter Balls, PA-C                                 Medical Decision Making Amount and/or Complexity of Data Reviewed Labs: ordered.        Final diagnoses:  Frontal headache  History of sinusitis  Paresthesias  Dizziness    ED Discharge Orders     None          Hunter Hunter Hunter Hunter 04/07/24 2000    Hunter Lavonia SAILOR, MD 04/07/24 2238

## 2024-04-07 NOTE — ED Notes (Signed)
 Awaiting patient from lobby.

## 2024-04-07 NOTE — Discharge Instructions (Signed)
 As we discussed, you are doing all the right things to discomfort he cause of your symptoms. No serious or life threatening condition is found on tonight's evaluation. Please follow up with your doctor as planned. Tylenol and/or ibuprofen for headache discomfort.

## 2024-04-07 NOTE — Telephone Encounter (Signed)
 Called pt to schedule for HFU however pt has appt for 04/14/2024 for 1 month f/u and would like for this appt to be kept and changed to a HFU if possible.

## 2024-04-07 NOTE — ED Triage Notes (Signed)
 Patient states he has lost his appetite, he is having trouble sleeping and he is dizziness. He states he ws at [facility] yesterday for a headache yesterday. He states that he woke up this morning with the same symptoms just worse. He states that he is having pain behind his eyes. And he is vomiting. This has been going on since Wednesday

## 2024-04-07 NOTE — ED Triage Notes (Signed)
 Patient states that all symptoms present since being given IV Decadron at [facility] ED two days ago. So he went to [facility] for same yesterday. Was given Toradol  yesterday which seemed to help temporarily but all symptoms present again today.

## 2024-04-10 ENCOUNTER — Ambulatory Visit
Admission: RE | Admit: 2024-04-10 | Discharge: 2024-04-10 | Disposition: A | Payer: Self-pay | Source: Ambulatory Visit | Attending: Family Medicine | Admitting: Family Medicine

## 2024-04-10 ENCOUNTER — Encounter: Payer: Self-pay | Admitting: Family Medicine

## 2024-04-10 DIAGNOSIS — R519 Headache, unspecified: Secondary | ICD-10-CM | POA: Insufficient documentation

## 2024-04-10 MED ORDER — GADOBUTROL 1 MMOL/ML IV SOLN
7.5000 mL | Freq: Once | INTRAVENOUS | Status: AC | PRN
  Administered 2024-04-10: 7.5 mL via INTRAVENOUS

## 2024-04-14 ENCOUNTER — Encounter: Payer: Self-pay | Admitting: Family Medicine

## 2024-04-14 ENCOUNTER — Ambulatory Visit: Payer: Self-pay | Admitting: Family Medicine

## 2024-04-14 VITALS — BP 116/68 | HR 69 | Ht 71.0 in | Wt 191.0 lb

## 2024-04-14 DIAGNOSIS — K760 Fatty (change of) liver, not elsewhere classified: Secondary | ICD-10-CM

## 2024-04-14 DIAGNOSIS — F419 Anxiety disorder, unspecified: Secondary | ICD-10-CM

## 2024-04-14 DIAGNOSIS — Z23 Encounter for immunization: Secondary | ICD-10-CM

## 2024-04-14 DIAGNOSIS — R17 Unspecified jaundice: Secondary | ICD-10-CM

## 2024-04-14 DIAGNOSIS — F411 Generalized anxiety disorder: Secondary | ICD-10-CM

## 2024-04-14 DIAGNOSIS — Z315 Encounter for genetic counseling: Secondary | ICD-10-CM

## 2024-04-14 DIAGNOSIS — D721 Eosinophilia, unspecified: Secondary | ICD-10-CM

## 2024-04-14 MED ORDER — ESCITALOPRAM OXALATE 10 MG PO TABS: 10.0000 mg | ORAL_TABLET | Freq: Every day | ORAL | 2 refills | Status: DC

## 2024-04-14 NOTE — Progress Notes (Signed)
 Established Patient Office Visit  Subjective   Patient ID: Hunter Jordan, male    DOB: 10-22-2000  Age: 23 y.o. MRN: [id number]  Chief Complaint  Patient presents with   Follow-up    66m f/u - Lexapro  / Buspar  - feels like anxiety has gotten worse - feels like he's having heart attack - has been on lexapro  in the past and is okay with it but not okay with buspar   With this combo he is having trouble sleeping, anxiety very high, does not want to do anything  Has not taken any hydroxazine  Would like to know if we are doing labs - during Hospital visit blood work done - MPV was high?   Results    Did MRI on Monday has not heard anything - did a heart monitor was done for 2 weeks was mailed back wants to know if you have the results   Hospitalization Follow-up    Has been in the ER multiple times recently  Pt had a headache and went to ER was given decadron and had a bad experience ED 04/12/2024 - yesterday - chest pain    HPI Discussed the use of AI scribe software for clinical note transcription with the patient, who gave verbal consent to proceed.  History of Present Illness   Hunter Jordan is a 23 year old male with anxiety and panic attacks who presents with recurrent emergency department visits due to panic symptoms.  He experiences frequent panic attacks characterized by breathlessness, chest pain, and a sensation of impending fainting. During these episodes, his blood pressure is elevated, but his heart rate does not exceed 100 bpm. He has been taught behavioral therapy to control a panic attack but he does not practice this.  He has not engaged in behavioral therapy recently and has not been practicing breathing techniques regularly. He experiences nocturnal panic attacks, waking up in a panic state feeling like he is about to die.  He is currently taking Lexapro  at a dose of 5 mg, which he feels may need to be increased. He reports no negative side effects from Lexapro .   However, he feels effects from Buspirone , including nervousness, jitteriness, an altered state of consciousness, and headaches. He has not taken hydroxyzine  despite it being prescribed, due to fear of side effects, recalling past experiences with hydroxyzine  causing weakness in his limbs and increased heart rate.  He has a history of a gallbladder polyp and fatty liver, with a previous ultrasound confirming these findings. He has experienced elevated liver enzymes.  He is not a drinker and questions the cause of his fatty liver, noting he weighs 191 pounds and has a belly. He has undergone a brain MRI, but the results are pending. He has also had a heart monitor and echocardiogram, with numerous EKGs, all indicating sinus rhythm.  He mentions a loss of appetite since Thanksgiving, which he associates with a Decadron shot he received at the hospital. He has a history of elevated bilirubin levels and elevated mean platelet volume (MPV).  He is concerned about genetic predispositions to conditions like rheumatoid arthritis, which runs in his family. He has experienced bone pain and is interested in preventive measures.   He uses a garment/textile technologist to monitor his health, noting stress levels, heart rate variability, and sleep patterns. He reports a low heart rate during sleep, sometimes dropping below 40 bpm, and a perfect sleep score recently. He practices breathing exercises and uses meditation apps to manage anxiety,  though he finds it challenging to control his symptoms.      Objective:     BP 116/68 (BP Location: Left Arm, Patient Position: Sitting, Cuff Size: Normal)   Pulse 69   Ht 5' 11 (1.803 m)   Wt 191 lb (86.6 kg)   SpO2 98%   BMI 26.64 kg/m    Physical Exam Vitals and nursing note reviewed.  Constitutional:      Appearance: Normal appearance.  HENT:     Head: Normocephalic and atraumatic.  Eyes:     Conjunctiva/sclera: Conjunctivae normal.  Cardiovascular:     Rate and Rhythm:  Normal rate and regular rhythm.  Pulmonary:     Effort: Pulmonary effort is normal.     Breath sounds: Normal breath sounds.  Musculoskeletal:     Right lower leg: No edema.     Left lower leg: No edema.  Skin:    General: Skin is warm and dry.  Neurological:     Mental Status: He is alert and oriented to person, place, and time.  Psychiatric:        Mood and Affect: Mood normal.        Behavior: Behavior normal.        Thought Content: Thought content normal.        Judgment: Judgment normal.          No results found for any visits on 04/14/24.    The ASCVD Risk score (Arnett DK, et al., 2019) failed to calculate for the following reasons:   The 2019 ASCVD risk score is only valid for ages 29 to 10    Assessment & Plan:  Anxiety -     Ambulatory referral to Psychology -     Escitalopram  Oxalate; Take 1 tablet (10 mg total) by mouth daily.  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  Orders: -     Bilirubin, fractionated(tot/dir/indir)  Fatty liver Assessment & Plan: Fatty liver on CT scan of abdomen.  Will check fib 4.  Orders: -     FIB-4  Immunization due Assessment & Plan: Gave him meningococcal B vaccine today.  Orders: -     Meningococcal B, OMV  GAD (generalized anxiety disorder) Assessment & Plan: Diagrammed the fight or flight mechanism for him.   Explained using breathing techniques to control panic and anxiety.  Ask him to work with a psychologist on controlling his anxiety better.  Increase Lexapro  to 10 mg daily follow-up in a month.   Eosinophilia, unspecified type Assessment & Plan: Eosinophilia at 0.5, very mild.  Likely represents allergies.      Return in about 4 weeks (around 05/12/2024).    Terrance Lanahan K Ameia Morency, MD

## 2024-04-16 DIAGNOSIS — Z23 Encounter for immunization: Secondary | ICD-10-CM | POA: Insufficient documentation

## 2024-04-16 DIAGNOSIS — F419 Anxiety disorder, unspecified: Secondary | ICD-10-CM

## 2024-04-16 DIAGNOSIS — K760 Fatty (change of) liver, not elsewhere classified: Secondary | ICD-10-CM | POA: Insufficient documentation

## 2024-04-16 DIAGNOSIS — F411 Generalized anxiety disorder: Secondary | ICD-10-CM | POA: Insufficient documentation

## 2024-04-16 DIAGNOSIS — R17 Unspecified jaundice: Secondary | ICD-10-CM | POA: Insufficient documentation

## 2024-04-16 DIAGNOSIS — D721 Eosinophilia, unspecified: Secondary | ICD-10-CM | POA: Insufficient documentation

## 2024-04-16 NOTE — Assessment & Plan Note (Signed)
 Fatty liver on CT scan of abdomen.  Will check fib 4.

## 2024-04-16 NOTE — Assessment & Plan Note (Signed)
 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

## 2024-04-16 NOTE — Assessment & Plan Note (Signed)
 Diagrammed the fight or flight mechanism for him.   Explained using breathing techniques to control panic and anxiety.  Ask him to work with a psychologist on controlling his anxiety better.  Increase Lexapro  to 10 mg daily follow-up in a month.

## 2024-04-16 NOTE — Assessment & Plan Note (Signed)
 Eosinophilia at 0.5, very mild.  Likely represents allergies.

## 2024-04-16 NOTE — Assessment & Plan Note (Signed)
 Gave him meningococcal B vaccine today.

## 2024-04-17 ENCOUNTER — Telehealth: Payer: Self-pay | Admitting: Family Medicine

## 2024-04-17 ENCOUNTER — Ambulatory Visit: Payer: Self-pay | Admitting: Family Medicine

## 2024-04-17 ENCOUNTER — Other Ambulatory Visit: Payer: Self-pay | Admitting: Family Medicine

## 2024-04-17 DIAGNOSIS — H5040 Unspecified heterotropia: Secondary | ICD-10-CM

## 2024-04-17 NOTE — Telephone Encounter (Signed)
 Copied from CRM #[id number]. Topic: Clinical - Lab/Test Results >> Apr 17, 2024 11:51 AM Winona R wrote: Pt resquesting a call from Dr. Ziglar in regards to test results, pt does not understand them.

## 2024-04-17 NOTE — Telephone Encounter (Signed)
 Discussed his telemetry results.  Discussed his brain MRI results.  He has a referral to a Neurologist to discuss his MRI.

## 2024-04-18 NOTE — Telephone Encounter (Signed)
 Dr. Onita spoke with pt about results yesterday.  The documentation is in another encounter

## 2024-04-24 NOTE — ED Provider Notes (Addendum)
 Emergency Department Provider Note    ED Clinical Impression   Final diagnoses:  Dehydration (Primary)  Vomiting, unspecified vomiting type, unspecified whether nausea present  Tachycardia    ED Assessment/Plan    History   Chief Complaint  Patient presents with   Diarrhea   Emesis   Heart Palpitations   HPI  1850 hrs. Patient 23 year old with complaints of abdominal pain nausea vomiting diarrhea since yesterday apparently increased his Lexapro  dose at that time.  Has taken higher dose without difficulty in the past but not for a long time by exam awake alert cooperative cardiopulmonary general neuroexam negative abdomen benign he is tachycardic low suspicion of other major toxidrome withdrawal or intoxication or sepsis will consider for outpatient treatment referral with cessation of Lexapro   Past Medical History[1]  Past Surgical History[2]  Family History[3]  Social History[4]  Review of Systems  Respiratory:  Negative for chest tightness and shortness of breath.   Cardiovascular:  Positive for palpitations.  Gastrointestinal:  Positive for nausea. Negative for abdominal pain.  All other systems reviewed and are negative.   Physical Exam   BP 148/87   Pulse 102   Temp 36.6 C (97.9 F) (Oral)   Resp 20   Ht 180.3 cm (5' 11)   Wt 84.6 kg (186 lb 6.4 oz)   SpO2 100%   BMI 26.00 kg/m   Physical Exam  Vital signs have been reviewed. Patient is well-appearing w/o respiratory distress shock or major trauma. HEENT is atraumatic.  Neck shows unimpaired range of motion. Chest No increased work of breathing or audible wheezing. Cardiac Good perfusion throughout. Abdomen is not distended.  Tachycardia noted given nontender Extremities are without significant trauma. Skin no visualized rash. Neuro exam is grossly non-focal. Psych exam shows normal mood and behavior.  ED Course    SSRI symptoms related probably to Lexapro  dosing stop medication primary  care follow-up   Medical Decision Making Amount and/or Complexity of Data Reviewed Labs: ordered. Radiology: ordered. ECG/medicine tests: ordered.  Risk Prescription drug management.    20 0 0 hours. Findings consistent with vomiting and dehydration will go ahead and plan for outpatient treatment and referral with close follow-up and patient side effect most likely she would likely benefit from IV fluids   EKG shows a sinus tachycardia with no acute ST-T wave changes normal intervals   2145 hrs. Workup is negative at this time plan discharge home with close follow-up     Dallara, Norleen Agent, MD 04/24/24 1851    [place] Norleen Agent, MD 04/24/24 LEANOR    [place] Norleen Agent, MD 04/24/24 2039       [1] Past Medical History: Diagnosis Date   Anxiety    Depersonalization disorder    (CMS-HCC)    PTSD (post-traumatic stress disorder)   [2] No past surgical history on file. [3] No family history on file. [4] Social History Socioeconomic History   Marital status: Single  Tobacco Use   Smoking status: Former    Types: Cigarettes   Smokeless tobacco: Never  Vaping Use   Vaping status: Every Day   Substances: Nicotine  Substance and Sexual Activity   Alcohol use: Not Currently   Drug use: Never   Social Drivers of Health   Food Insecurity: No Food Insecurity (04/10/2024)   Received from [facility]   Hunger Vital Sign    Within the past 12 months, you worried that your food would run out before you got the money to buy more.:  Never true    Within the past 12 months, the food you bought just didn't last and you didn't have money to get more.: Never true  Tobacco Use: Medium Risk (04/17/2024)   Patient History    Smoking Tobacco Use: Former    Smokeless Tobacco Use: Never  Transportation Needs: No Transportation Needs (04/10/2024)   Received from [facility] - Transportation    In the past 12 months, has lack of transportation kept  you from medical appointments or from getting medications?: No    In the past 12 months, has lack of transportation kept you from meetings, work, or from getting things needed for daily living?: No  Housing: High Risk (12/03/2023)   Housing    Within the past 12 months, have you ever stayed: outside, in a car, in a tent, in an overnight shelter, or temporarily in someone else's home (i.e. couch-surfing)?: No    Are you worried about losing your housing?: Yes  Physical Activity: Insufficiently Active (04/10/2024)   Received from [facility]   Exercise Vital Sign    On average, how many days per week do you engage in moderate to strenuous exercise (like a brisk walk)?: 4 days    On average, how many minutes do you engage in exercise at this level?: 30 min  Utilities: Low Risk (12/03/2023)   Utilities    Within the past 12 months, have you been unable to get utilities (heat, electricity) when it was really needed?: No  Stress: Stress Concern Present (04/10/2024)   Received from [facility] - Occupational Stress Questionnaire    Do you feel stress - tense, restless, nervous, or anxious, or unable to sleep at night because your mind is troubled all the time - these days?: Very much  Interpersonal Safety: Not At Risk (12/03/2023)   Interpersonal Safety    Unsafe Where You Currently Live: No    Physically Hurt by Anyone: No    Abused by Anyone: No  Social Connections: Socially Integrated (04/10/2024)   Received from [facility]   Social Connection and Isolation Panel    In a typical week, how many times do you talk on the phone with family, friends, or neighbors?: More than three times a week    How often do you get together with friends or relatives?: Twice a week    How often do you attend church or religious services?: More than 4 times per year    Do you belong to any clubs or organizations such as church groups, unions, fraternal or  athletic groups, or school groups?: Yes    How often do you attend meetings of the clubs or organizations you belong to?: More than 4 times per year    Are you married, widowed, divorced, separated, never married, or living with a partner?: Living with partner  Physicist, Medical Strain: Low Risk (04/10/2024)   Received from [facility]   Overall Financial Resource Strain (CARDIA)    How hard is it for you to pay for the very basics like food, housing, medical care, and heating?: Not hard at all  Health Literacy: Low Risk (12/03/2023)   Health Literacy    : Never  Internet Connectivity: No Internet connectivity concern identified (12/03/2023)   Internet Connectivity    Do you have access to internet services: Yes    How do you connect to the internet: Personal Device at home    Is your  internet connection strong enough for you to watch video on your device without major problems?: Yes    Do you have enough data to get through the month?: Yes    Does at least one of the devices have a camera that you can use for video chat?: Yes   Dallara, John James, MD 04/24/24 2144

## 2024-04-25 ENCOUNTER — Emergency Department (HOSPITAL_COMMUNITY)
Admission: EM | Admit: 2024-04-25 | Discharge: 2024-04-25 | Disposition: A | Discharge status: Home / Self Care | Payer: Self-pay | Attending: Emergency Medicine | Admitting: Emergency Medicine

## 2024-04-25 ENCOUNTER — Other Ambulatory Visit: Payer: Self-pay

## 2024-04-25 ENCOUNTER — Emergency Department (HOSPITAL_COMMUNITY): Payer: Self-pay

## 2024-04-25 ENCOUNTER — Encounter (HOSPITAL_COMMUNITY): Payer: Self-pay | Admitting: Emergency Medicine

## 2024-04-25 ENCOUNTER — Ambulatory Visit: Payer: Self-pay | Admitting: *Deleted

## 2024-04-25 DIAGNOSIS — R091 Pleurisy: Secondary | ICD-10-CM

## 2024-04-25 LAB — CBC
HCT: 45.3 % (ref 39.0–52.0)
Hemoglobin: 15.3 g/dL (ref 13.0–17.0)
MCH: 31.4 pg (ref 26.0–34.0)
MCHC: 33.8 g/dL (ref 30.0–36.0)
MCV: 92.8 fL (ref 80.0–100.0)
Platelets: 248 K/uL (ref 150–400)
RBC: 4.88 MIL/uL (ref 4.22–5.81)
RDW: 12.5 % (ref 11.5–15.5)
WBC: 10.8 K/uL — ABNORMAL HIGH (ref 4.0–10.5)
nRBC: 0 % (ref 0.0–0.2)

## 2024-04-25 LAB — BASIC METABOLIC PANEL WITH GFR
Anion gap: 14 (ref 5–15)
BUN: 11 mg/dL (ref 6–20)
CO2: 23 mmol/L (ref 22–32)
Calcium: 9.4 mg/dL (ref 8.9–10.3)
Chloride: 105 mmol/L (ref 98–111)
Creatinine, Ser: 0.97 mg/dL (ref 0.61–1.24)
GFR, Estimated: >60 mL/min (ref >60)
Glucose, Bld: 84 mg/dL (ref 70–99)
Potassium: 3.8 mmol/L (ref 3.5–5.1)
Sodium: 141 mmol/L (ref 135–145)

## 2024-04-25 LAB — D-DIMER, QUANTITATIVE: D-Dimer, Quant: 0.5 ug{FEU}/mL (ref 0.00–0.50)

## 2024-04-25 LAB — TROPONIN T, HIGH SENSITIVITY: Troponin T High Sensitivity: <15 ng/L (ref 0–19)

## 2024-04-25 MED ORDER — IBUPROFEN 600 MG PO TABS: 600.0000 mg | ORAL_TABLET | Freq: Three times a day (TID) | ORAL | 0 refills | Status: AC

## 2024-04-25 MED ORDER — KETOROLAC TROMETHAMINE 30 MG/ML IJ SOLN
15.0000 mg | Freq: Once | INTRAMUSCULAR | Status: AC
  Administered 2024-04-25: 15:00:00 15 mg via INTRAVENOUS
  Filled 2024-04-25: qty 1

## 2024-04-25 NOTE — Discharge Instructions (Signed)
 Your labs, EKG and chest x-ray are all normal today.  Your symptoms suggest an inflammation of your chest wall or possibly your lungs, condition called pleurisy.  You are being prescribed an anti-inflammatory to help you with your symptoms.  Plan close follow-up with your primary provider if this medication does not improve the symptoms.

## 2024-04-25 NOTE — ED Provider Notes (Signed)
 [place] EMERGENCY DEPARTMENT AT [facility] Provider Note   CSN: [id number] Arrival date & time: 04/25/24  1400     Patient presents with: Chest Pain   Hunter Jordan is a 23 y.o. male presenting with a 24-hour history of left sided upper chest pain that radiates into his left shoulder and is triggered by taking a deep breath, not triggered by palpation or movement although he does state his pain does seem worse when he is supine.  Denies cough, fevers or chills.  Patient has a history of anxiety and depression, is on Lexapro , felt increasingly symptomatic over the weekend took additional Lexapro  and within hours had significant nausea with very frequent diarrhea, however the symptoms have improved after being seen at [facility] ER yesterday.  Today symptoms started gradually yesterday.  He has found no alleviators for the symptoms.  Denies peripheral edema.  Denies risk PE.   The history is provided by the patient.       Prior to Admission medications  Medication Sig Start Date End Date Taking? Authorizing Provider  busPIRone  (BUSPAR ) 5 MG tablet Take 1 tablet (5 mg total) by mouth 3 (three) times daily. 03/23/24   Ziglar, Susan K, MD  cyclobenzaprine  (FLEXERIL ) 5 MG tablet Take 1 tablet (5 mg total) by mouth 3 (three) times daily as needed (muscle soreness). Patient not taking: Reported on 04/14/2024 10/24/19   Knapp, Iva, MD  escitalopram  (LEXAPRO ) 10 MG tablet Take 1 tablet (10 mg total) by mouth daily. 04/14/24   Ziglar, Susan K, MD  hydrOXYzine  (ATARAX ) 10 MG tablet Take 1 tablet (10 mg total) by mouth 3 (three) times daily as needed. Patient not taking: Reported on 04/14/2024 03/23/24   Ziglar, Susan K, MD  ibuprofen  (ADVIL ) 600 MG tablet Take 1 tablet (600 mg total) by mouth 3 (three) times daily for 7 days. 04/25/24 05/02/24 Yes Shloima Clinch, PA-C  pantoprazole  (PROTONIX ) 20 MG tablet Take 1 tablet (20 mg total) by mouth daily. Patient not taking: Reported on 04/14/2024  12/10/23   Horton, Charmaine FALCON, MD    Allergies: Dexamethasone    Review of Systems  Constitutional:  Negative for chills and fever.  HENT:  Negative for congestion and sore throat.   Eyes: Negative.   Respiratory:  Negative for chest tightness and shortness of breath.   Cardiovascular:  Positive for chest pain.  Gastrointestinal:  Negative for abdominal pain, nausea and vomiting.  Genitourinary: Negative.   Musculoskeletal:  Positive for arthralgias. Negative for joint swelling and neck pain.  Skin: Negative.  Negative for rash and wound.  Neurological:  Negative for dizziness, weakness, light-headedness, numbness and headaches.  Psychiatric/Behavioral: Negative.      Updated Vital Signs BP 122/70   Pulse 70   Temp 98 F (36.7 C) (Oral)   Resp 17   Ht 5' 11 (1.803 m)   Wt 85.7 kg   SpO2 97%   BMI 26.36 kg/m   Physical Exam Vitals and nursing note reviewed.  Constitutional:      Appearance: He is well-developed.  HENT:     Head: Normocephalic and atraumatic.  Eyes:     Conjunctiva/sclera: Conjunctivae normal.  Cardiovascular:     Rate and Rhythm: Normal rate and regular rhythm.     Heart sounds: Normal heart sounds.  Pulmonary:     Effort: Pulmonary effort is normal.     Breath sounds: Normal breath sounds. No decreased breath sounds, wheezing, rhonchi or rales.  Chest:  Chest wall: No tenderness, crepitus or edema.  Abdominal:     General: Bowel sounds are normal.     Palpations: Abdomen is soft.     Tenderness: There is no abdominal tenderness.  Musculoskeletal:        General: Normal range of motion.     Cervical back: Normal range of motion.     Right lower leg: No edema.     Left lower leg: No edema.  Skin:    General: Skin is warm and dry.  Neurological:     General: No focal deficit present.     Mental Status: He is alert.     (all labs ordered are listed, but only abnormal results are displayed) Labs Reviewed  CBC - Abnormal; Notable for the  following components:      Result Value   WBC 10.8 (*)    All other components within normal limits  BASIC METABOLIC PANEL WITH GFR  D-DIMER, QUANTITATIVE  TROPONIN T, HIGH SENSITIVITY    EKG: None ED ECG REPORT   Date: 04/25/2024  Rate: 73  Rhythm: normal sinus rhythm  QRS Axis: normal  Intervals: normal  ST/T Wave abnormalities: normal  Conduction Disutrbances:none  Narrative Interpretation:   Old EKG Reviewed: changes noted,  right axis not present today  I have personally reviewed the EKG tracing and agree with the computerized printout as noted.  Radiology: DG Chest Portable 1 View Result Date: 04/25/2024 CLINICAL DATA:  Pleuritic left-sided chest pain EXAM: PORTABLE CHEST 1 VIEW COMPARISON:  May 22, 2023 FINDINGS: The heart size and mediastinal contours are within normal limits. Both lungs are clear. The visualized skeletal structures are unremarkable. IMPRESSION: No active disease. Electronically Signed   By: Lynwood Landy Raddle M.D.   On: 04/25/2024 14:55     Procedures   Medications Ordered in the ED  ketorolac  (TORADOL ) 30 MG/ML injection 15 mg (15 mg Intravenous Given 04/25/24 1517)                                    Medical Decision Making Patient presenting with a 24-hour history of left-sided chest pain described as sharp, pleuritic in character, not reproducible, differential diagnosis including musculoskeletal pain, pleurisy, PE, unstable angina highly unlikely given nature of symptoms and patient's age.  Pneumonia/bronchitis.  Labs and imaging obtained and are reassuring.  He was given an IV dose of Toradol  which gave modest improvement in his pain symptoms suggest this is a inflammatory, probable  pleurisy.  He will be placed on ibuprofen  3 times daily x 1 week, encouraged close follow-up with his PCP for recheck if symptoms persist.  Return precautions outlined.  Amount and/or Complexity of Data Reviewed Labs: ordered.    Details: Labs reviewed,  D-dimer normal at 0.50, be met and troponin are negative, CBC significant for borderline elevated WBC count of 10.8, otherwise negative. Radiology: ordered.    Details: Chest x-ray reviewed, negative. ECG/medicine tests: ordered.    Details: Normal sinus rhythm rate 73  Risk Prescription drug management.        Final diagnoses:  Pleurisy    ED Discharge Orders          Ordered    ibuprofen  (ADVIL ) 600 MG tablet  3 times daily        04/25/24 1556               Alden Feagan, PA-C 04/25/24 1613

## 2024-04-25 NOTE — ED Triage Notes (Signed)
 Pt c/o left sided chest pain and left shoulder pain that is worse when he takes a deep breath. States that he was seen at [facility] yesterday.

## 2024-04-25 NOTE — Telephone Encounter (Signed)
 FYI Only or Action Required?: FYI only for provider: ED advised.  Patient was last seen in primary care on 04/14/2024 by Ziglar, Susan K, MD.  Called Nurse Triage reporting Chest Pain. Pain left shoulder breathing in   Symptoms began yesterday.  Interventions attempted: Rest, hydration, or home remedies.  Symptoms are: rapidly worsening.  Triage Disposition: Go to ED Now (Notify PCP)  Patient/caregiver understands and will follow disposition?: Yes    Patient requesting call back regarding medications. See NT encounter.         Copied from CRM #[id number]. Topic: Clinical - Red Word Triage >> Apr 25, 2024  1:37 PM Hunter Jordan wrote: Red Word that prompted transfer to Nurse Triage: Patient reported he started the increased dosage of escitalopram  (LEXAPRO ) 10 MG tablet on 04/23/2024 and as soon as he started taking the increased dosage he started experiencing extreme diarrhea, heart rate increase and  extreme agitation. Patient stated that his heart rate increased to the point where even while he was resting it never went below 100 bpm, patient went to the ER yesterday where he was given fluids for dehydration and prescribed some meds, he was also told that he was having serotonin syndrome and that he needed to stop taking the escitalopram  (LEXAPRO ) 10 MG tablet. Patient stated that he is not having these symptoms anymore and that the only symptoms he is now having is pain in his left shoulder where it hurts when he inhales. Reason for Disposition  Pain also in shoulder(s) or arm(s) or jaw  (Exception: Pain is clearly made worse by movement.)  Answer Assessment - Initial Assessment Questions Recommended ED now and patient reports he was in parking lot of Hunter Jordan now. Patient requesting what to do regarding medications. Stopped lexapro  after increasing dose Sunday and developing severe diarrhea x 15 times. Noted severe agitation after taking lexapro . Elevated heart rate and seen in ED  yesterday. Patient requesting a call back how to proceed. Recommended patient go for evaluation in ED at [facility] and call back for appt. Patient requesting call back.       1. LOCATION: Where does it hurt?       Chest and left shoulder 2. RADIATION: Does the pain go anywhere else? (e.g., into neck, jaw, arms, back)     Left shoulder 3. ONSET: When did the chest pain begin? (Minutes, hours or days)      Yesterday and now  4. PATTERN: Does the pain come and go, or has it been constant since it started?  Does it get worse with exertion?      Constant now and hurts to inhale 5. DURATION: How long does it last (e.g., seconds, minutes, hours)     na 6. SEVERITY: How bad is the pain?  (e.g., Scale 1-10; mild, moderate, or severe)     Pain left shoulder worse  7. CARDIAC RISK FACTORS: Do you have any history of heart problems or risk factors for heart disease? (e.g., angina, prior heart attack; diabetes, high blood pressure, high cholesterol, smoker, or strong family history of heart disease)     na 8. PULMONARY RISK FACTORS: Do you have any history of lung disease?  (e.g., blood clots in lung, asthma, emphysema, birth control pills)     na 9. CAUSE: What do you think is causing the chest pain?     Not sure treated for dehydration yesterday 10. OTHER SYMPTOMS: Do you have any other symptoms? (e.g., dizziness, nausea, vomiting, sweating, fever, difficulty breathing,  cough)       Chest pain pain taking deep breath in. Feels sense of impending doom, body doesn't feel right or normal 11. PREGNANCY: Is there any chance you are pregnant? When was your last menstrual period?       na  Protocols used: Chest Pain-A-AH

## 2024-05-05 ENCOUNTER — Encounter: Payer: Self-pay | Admitting: Family Medicine

## 2024-05-05 ENCOUNTER — Ambulatory Visit: Payer: Self-pay | Admitting: Family Medicine

## 2024-05-05 VITALS — BP 109/71 | HR 87 | Temp 98.0°F | Resp 16 | Ht 71.0 in | Wt 185.0 lb

## 2024-05-05 DIAGNOSIS — Q048 Other specified congenital malformations of brain: Secondary | ICD-10-CM

## 2024-05-05 DIAGNOSIS — R1011 Right upper quadrant pain: Secondary | ICD-10-CM

## 2024-05-05 DIAGNOSIS — F411 Generalized anxiety disorder: Secondary | ICD-10-CM

## 2024-05-05 NOTE — Assessment & Plan Note (Signed)
 He was able to take Lexapro  5 mg for a month without significant side effects.  Try taking Lexapro  5 mg again please.

## 2024-05-05 NOTE — Progress Notes (Signed)
 "  Established Patient Office Visit  Subjective   Patient ID: Hunter Jordan, male    DOB: 09-Apr-2001  Age: 23 y.o. MRN: [id number]  Chief Complaint  Patient presents with   Medical Management of Chronic Issues    Pt was seen at ED on 04/25/2024. Pt wants to get Hep C screening today. [place] he has already had HIV screening. Needs labs done somewhere where he won't have to pay at time of labs.     Neurologic Problem    Had neurology referral placed but they cannot see him until end of march and pt states he is having more frequent sxs like deja vu, disassociation, and euphoric feelings. Lots of brain fog and weird sensations through his body.     Neurologic Problem   Discussed the use of AI scribe software for clinical note transcription with the patient, who gave verbal consent to proceed.  History of Present Illness   Hunter Jordan is a 23 year old male who presents with symptoms following a recent increase in Lexapro  dosage.  He was able to take Lexapro  5mg  for a month without difficulty.  He experienced severe diarrhea, agitation, insomnia, and loss of appetite after increasing his Lexapro  dosage from 5 mg to 10 mg. The diarrhea was severe, leading to dehydration, and he had a high resting heart rate of 157 bpm. He also felt confused and disoriented, prompting a visit to the ER where he was advised to stop taking Lexapro  due to suspected serotonin syndrome.  He has not taken Lexapro , hydroxyzine , or Buspar  since the ER visit, except for a Toradol  shot received at the hospital. He recalls being given Compazine, which caused severe discomfort and numbness, but he did not receive Benadryl with it, which he was told is typically administered together.  Following the initial ER visit, the next day, he experienced left shoulder pain and pleuritic chest pain, which worsened, leading to a second ER visit. He was told 'fluoracaine' at the ER, and the pain resolved after two days without  medication. He now reports intermittent pain in the right upper quadrant, exacerbated by prolonged sitting, described as 'bad pain' behind the ribs, possibly related to a gallbladder issue.  He had a RUQ US  03/27/24 that showed a polyp and sludge in his gall bladder.    He has a history of a gallbladder polyp and fatty liver and elevated liver enzymes, with a previous ultrasound showing these findings. No alcohol use.    He experiences ongoing anxiety symptoms, including feelings of impending doom, derealization, and depersonalization, but notes that his heart rate does not elevate significantly during these episodes. He describes a history of panic attacks but notes a change in his presentation, now characterized by sensations of dread and weird bodily sensations rather than classic panic symptoms.  He has a family history of epilepsy on his father's side, with his father, uncle, and grandfather affected. He expresses concern about the possibility of focal seizures contributing to his symptoms.  He has a congenital malformation, nodular heterotopia, of his brain that was discovered with brain MRI 04/10/2024.  He has an appointment with Neurology in March.    He has previously been prescribed Klonopin, Ativan, Buspar , Lexapro , and Zoloft for anxiety, with varying degrees of effectiveness and side effects. He currently has hydroxyzine  25 mg available but has not used it recently.       Objective:     BP 109/71   Pulse 87   Temp 98  F (36.7 C) (Oral)   Resp 16   Ht 5' 11 (1.803 m)   Wt 185 lb (83.9 kg)   SpO2 97%   BMI 25.80 kg/m    Physical Exam Vitals reviewed.  Constitutional:      Appearance: Normal appearance.  HENT:     Head: Normocephalic.  Eyes:     General:        Right eye: No discharge.        Left eye: No discharge.  Cardiovascular:     Rate and Rhythm: Normal rate.  Pulmonary:     Effort: Pulmonary effort is normal.  Abdominal:     Comments: Tenderness over the  floating ribs right side.   Neurological:     Mental Status: He is alert and oriented to person, place, and time.  Psychiatric:        Mood and Affect: Mood normal.        Behavior: Behavior normal.        Thought Content: Thought content normal.        Judgment: Judgment normal.          No results found for any visits on 05/05/24.    The ASCVD Risk score (Arnett DK, et al., 2019) failed to calculate for the following reasons:   The 2019 ASCVD risk score is only valid for ages 6 to 21   * - Cholesterol units were assumed    Assessment & Plan:  GAD (generalized anxiety disorder) Assessment & Plan: He was able to take Lexapro  5 mg for a month without significant side effects.  Try taking Lexapro  5 mg again please.   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) Assessment & Plan: Has an appointment with neurology in March 2026.  Wonders if nodular heterotopia could cause seizures.  Epilepsy runs in his family on his father side.   RUQ pain Assessment & Plan: Had a right upper quadrant ultrasound November 2025.  Reassurance that the pain in his right upper quadrant area is likely musculoskeletal.      Return in about 4 weeks (around 06/02/2024).    Ishmael Berkovich K Edmundo Tedesco, MD "

## 2024-05-05 NOTE — Assessment & Plan Note (Signed)
 Had a right upper quadrant ultrasound November 2025.  Reassurance that the pain in his right upper quadrant area is likely musculoskeletal.

## 2024-05-05 NOTE — Assessment & Plan Note (Signed)
 Has an appointment with neurology in March 2026.  Wonders if nodular heterotopia could cause seizures.  Epilepsy runs in his family on his father side.

## 2024-05-10 ENCOUNTER — Other Ambulatory Visit: Payer: Self-pay

## 2024-05-10 ENCOUNTER — Emergency Department (HOSPITAL_COMMUNITY)
Admission: EM | Admit: 2024-05-10 | Discharge: 2024-05-10 | Disposition: A | Discharge status: Home / Self Care | Payer: Self-pay | Attending: Emergency Medicine | Admitting: Emergency Medicine

## 2024-05-10 DIAGNOSIS — R202 Paresthesia of skin: Secondary | ICD-10-CM

## 2024-05-10 DIAGNOSIS — R200 Anesthesia of skin: Secondary | ICD-10-CM | POA: Insufficient documentation

## 2024-05-10 DIAGNOSIS — F419 Anxiety disorder, unspecified: Secondary | ICD-10-CM | POA: Insufficient documentation

## 2024-05-10 NOTE — ED Provider Notes (Signed)
 " [place] EMERGENCY DEPARTMENT AT [facility] Provider Note   CSN: [id number] Arrival date & time: 05/10/24  9840     Patient presents with: Anxiety   Hunter Jordan is a 23 y.o. male.   Patient is a 23 year old male with past medical history of anxiety and depression.  Patient presenting today with complaints of numbness in his legs that began while he was lying down.  This sensation then spread to his arms and he began feeling his heart race.  He felt short of breath, so his significant other brought him to the ER to be evaluated.  He reports feeling dizzy, pressure in his head, and nauseous.  Patient has been seen in the ER very frequently with similar complaints.  He tells me he had an MRI done in the beginning of December showing periventricular nodular heterotopia and has an appointment with a neurologist in March.       Prior to Admission medications  Not on File    Allergies: Dexamethasone    Review of Systems  All other systems reviewed and are negative.   Updated Vital Signs BP (!) 144/86 (BP Location: Right Arm)   Pulse (!) 107   Temp 98.2 F (36.8 C) (Oral)   Resp 16   Ht 5' 11 (1.803 m)   Wt 82.1 kg   SpO2 98%   BMI 25.24 kg/m   Physical Exam Vitals and nursing note reviewed.  Constitutional:      General: He is not in acute distress.    Appearance: He is well-developed. He is not diaphoretic.  HENT:     Head: Normocephalic and atraumatic.     Mouth/Throat:     Mouth: Mucous membranes are moist.  Eyes:     Extraocular Movements: Extraocular movements intact.     Pupils: Pupils are equal, round, and reactive to light.  Cardiovascular:     Rate and Rhythm: Normal rate and regular rhythm.     Heart sounds: No murmur heard.    No friction rub.  Pulmonary:     Effort: Pulmonary effort is normal. No respiratory distress.     Breath sounds: Normal breath sounds. No wheezing or rales.  Abdominal:     General: Bowel sounds are normal.  There is no distension.     Palpations: Abdomen is soft.     Tenderness: There is no abdominal tenderness.  Musculoskeletal:        General: Normal range of motion.     Cervical back: Normal range of motion and neck supple.  Skin:    General: Skin is warm and dry.  Neurological:     General: No focal deficit present.     Mental Status: He is alert and oriented to person, place, and time.     Cranial Nerves: No cranial nerve deficit.     Motor: No weakness.     Coordination: Coordination normal.     (all labs ordered are listed, but only abnormal results are displayed) Labs Reviewed - No data to display  EKG: None  Radiology: No results found.   Procedures   Medications Ordered in the ED - No data to display                                  Medical Decision Making  Patient presenting with multiple complaints as described in the HPI.  His presentation is most consistent with  a panic attack.  His vital signs are stable here and his physical examination is completely unremarkable.  He is neurologically intact and appears well-hydrated.  I have reviewed his record from his recent visits to the emergency department here and at other facilities as well as his clinic notes with his primary care doctor.  He has had laboratory studies on multiple occasions and imaging studies showing basically no acute process.  Given the clinical situation, I do not feel as though additional testing is indicated.  He seems back to baseline and I feel can safely be discharged.  He does seem somewhat anxious and I highly suspect his symptoms are anxiety related.     Final diagnoses:  None    ED Discharge Orders     None          Geroldine Berg, MD 05/10/24 [phone]  "

## 2024-05-10 NOTE — ED Triage Notes (Signed)
 Pt from home, has multiple complaints. Feels anxious, had leg numbness earlier but now they feel weak. He couldn't catch his breath earlier and his heart started racing. He has had panic attacks in the past. He is does feel nauseous. Pain in left cheek and lower back.

## 2024-05-10 NOTE — Discharge Instructions (Signed)
 Follow-up with your primary doctor to discuss your situation and follow-up with neurology as previously scheduled.

## 2024-05-18 ENCOUNTER — Ambulatory Visit: Payer: Self-pay | Admitting: Family Medicine

## 2024-05-23 ENCOUNTER — Ambulatory Visit: Payer: Self-pay | Admitting: Psychology

## 2024-06-05 ENCOUNTER — Ambulatory Visit: Payer: Self-pay | Admitting: Family Medicine

## 2024-06-09 ENCOUNTER — Telehealth: Payer: Self-pay | Admitting: Family Medicine

## 2024-06-09 DIAGNOSIS — L74513 Primary focal hyperhidrosis, soles: Secondary | ICD-10-CM

## 2024-06-09 DIAGNOSIS — M79671 Pain in right foot: Secondary | ICD-10-CM

## 2024-06-09 DIAGNOSIS — M79672 Pain in left foot: Secondary | ICD-10-CM

## 2024-06-09 MED ORDER — ALUMINUM CHLORIDE 20 % EX SOLN: CUTANEOUS | 3 refills | Status: AC

## 2024-06-09 NOTE — Progress Notes (Signed)
 Virtual Visit via Video Note  I connected with Hunter Jordan on 06/09/24 at  2:50 PM EST by a video enabled telemedicine application and verified that I am speaking with the correct person using two identifiers.  Location: Patient: at work, on break Provider: at work, in office   I discussed the limitations of evaluation and management by telemedicine and the availability of in person appointments. The patient expressed understanding and agreed to proceed.  History of Present Illness: He has several problems to discuss.  He is not taking Lexapro  5 mg.  He took states that he feels a lot better without it he is more normal without it.  He has a therapist and has an appointment in February.  He still has anxiety but is better than it has been in the past does not feel like he is going to die all the time.  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  He thinks his stress is improved because he is getting a paycheck now.  He still experiences a racing heartbeat, dread, weird feelings, disassociation from his body and the sensation that he is living in a dream world. He is having some headaches he is waking up with them he is also having tooth aches.  He has been taking ibuprofen  400 mg once or twice a day. He has got foot problems.  His feet hurt all the time he has been wearing the same pair of boots for the last 2 years so changing shoes is not the problem.  His feet sweat and his socks are wet when he takes them off at night.  He complains that his feet smelled terrible.  He can take a shower they still smells terrible.  He has got discoloration of his toes.  He has got calluses on his toes and a blister on his fifth toe.  He has been using over-the-counter products for athlete's foot he has tried sprays and powders and nothing has worked.  He has also used an armed forces training and education officer and that was not effective.   Observations/Objective:   Assessment and Plan: Bilateral foot pain: Will refer to podiatry.  One possibility is that his boots are worn out or that he needs insoles.  They can deal with the calluses on his toes and the reason he has blisters. Hyperhidrosis of the feet: Prescription of Drysol. Anxiety: Has an appointment with a therapist this month and I think that will help.  Trial Lexapro  2.5 mg every day or every other day.  He tried to start with Lexapro  10 mg and got serotonin activation.  Advised that starting on a really low dose will avoid that.  Follow-up in a month please  Follow Up Instructions:    I discussed the assessment and treatment plan with the patient. The patient was provided an opportunity to ask questions and all were answered. The patient agreed with the plan and demonstrated an understanding of the instructions.   The patient was advised to call back or seek an in-person evaluation if the symptoms worsen or if the condition fails to improve as anticipated.  I provided 13 minutes of non-face-to-face time during this encounter.   Yazmin Locher K Krizia Flight, MD

## 2024-06-12 ENCOUNTER — Encounter: Payer: Self-pay | Admitting: Family Medicine

## 2024-06-13 NOTE — Telephone Encounter (Signed)
 Please see mychart message sent by pt and advise.

## 2024-06-20 ENCOUNTER — Ambulatory Visit: Payer: Self-pay | Admitting: Psychology

## 2024-06-29 ENCOUNTER — Ambulatory Visit: Payer: Self-pay | Admitting: Medical Genetics

## 2024-07-11 ENCOUNTER — Ambulatory Visit: Payer: Self-pay | Admitting: Family Medicine

## 2024-08-01 ENCOUNTER — Ambulatory Visit: Payer: Self-pay | Admitting: Diagnostic Neuroimaging
